# Patient Record
Sex: Female | Born: 1950 | Race: White | Hispanic: No | Marital: Married | Smoking: Never smoker
Health system: Southern US, Community
[De-identification: ages and names within clinical notes are randomized; demographics above are authoritative.]

## PROBLEM LIST (undated history)

## (undated) DIAGNOSIS — K219 Gastro-esophageal reflux disease without esophagitis: Secondary | ICD-10-CM

## (undated) DIAGNOSIS — K279 Peptic ulcer, site unspecified, unspecified as acute or chronic, without hemorrhage or perforation: Secondary | ICD-10-CM

## (undated) DIAGNOSIS — C801 Malignant (primary) neoplasm, unspecified: Secondary | ICD-10-CM

## (undated) HISTORY — PX: SPLENECTOMY: SUR1306

## (undated) HISTORY — PX: OTHER SURGICAL HISTORY: SHX169

## (undated) HISTORY — PX: CHOLECYSTECTOMY: SHX55

## (undated) HISTORY — PX: COLON SURGERY: SHX602

## (undated) HISTORY — PX: ABDOMINAL HYSTERECTOMY: SHX81

## (undated) HISTORY — PX: APPENDECTOMY: SHX54

## (undated) HISTORY — PX: DIAPHRAGM SURGERY: SHX612

---

## 2019-02-10 ENCOUNTER — Other Ambulatory Visit: Payer: Self-pay

## 2019-02-10 ENCOUNTER — Encounter (HOSPITAL_COMMUNITY): Payer: Self-pay

## 2019-02-10 ENCOUNTER — Emergency Department (HOSPITAL_COMMUNITY): Payer: Medicare Other

## 2019-02-10 ENCOUNTER — Emergency Department (HOSPITAL_COMMUNITY)
Admission: EM | Admit: 2019-02-10 | Discharge: 2019-02-10 | Disposition: A | Discharge status: Home / Self Care | Payer: Medicare Other | Attending: Emergency Medicine | Admitting: Emergency Medicine

## 2019-02-10 DIAGNOSIS — Z859 Personal history of malignant neoplasm, unspecified: Secondary | ICD-10-CM | POA: Diagnosis not present

## 2019-02-10 DIAGNOSIS — R1013 Epigastric pain: Secondary | ICD-10-CM | POA: Diagnosis present

## 2019-02-10 DIAGNOSIS — Z8509 Personal history of malignant neoplasm of other digestive organs: Secondary | ICD-10-CM | POA: Insufficient documentation

## 2019-02-10 DIAGNOSIS — R1012 Left upper quadrant pain: Secondary | ICD-10-CM | POA: Diagnosis not present

## 2019-02-10 HISTORY — DX: Malignant (primary) neoplasm, unspecified: C80.1

## 2019-02-10 LAB — COMPREHENSIVE METABOLIC PANEL
ALT: 16 U/L (ref 0–44)
AST: 22 U/L (ref 15–41)
Albumin: 4.3 g/dL (ref 3.5–5.0)
Alkaline Phosphatase: 78 U/L (ref 38–126)
Anion gap: 9 (ref 5–15)
BUN: 11 mg/dL (ref 8–23)
CO2: 24 mmol/L (ref 22–32)
Calcium: 9.6 mg/dL (ref 8.9–10.3)
Chloride: 103 mmol/L (ref 98–111)
Creatinine, Ser: 0.71 mg/dL (ref 0.44–1.00)
GFR calc Af Amer: >60 mL/min (ref >60)
GFR calc non Af Amer: >60 mL/min (ref >60)
Glucose, Bld: 110 mg/dL — ABNORMAL HIGH (ref 70–99)
Potassium: 4.1 mmol/L (ref 3.5–5.1)
Sodium: 136 mmol/L (ref 135–145)
Total Bilirubin: 0.9 mg/dL (ref 0.3–1.2)
Total Protein: 7.5 g/dL (ref 6.5–8.1)

## 2019-02-10 LAB — URINALYSIS, ROUTINE W REFLEX MICROSCOPIC
Bacteria, UA: NONE SEEN
Bilirubin Urine: NEGATIVE
Glucose, UA: NEGATIVE mg/dL
Ketones, ur: NEGATIVE mg/dL
Leukocytes,Ua: NEGATIVE
Nitrite: NEGATIVE
Protein, ur: NEGATIVE mg/dL
Specific Gravity, Urine: 1.002 — ABNORMAL LOW (ref 1.005–1.030)
pH: 7 (ref 5.0–8.0)

## 2019-02-10 LAB — CBC
HCT: 43.5 % (ref 36.0–46.0)
Hemoglobin: 13.9 g/dL (ref 12.0–15.0)
MCH: 30.6 pg (ref 26.0–34.0)
MCHC: 32 g/dL (ref 30.0–36.0)
MCV: 95.8 fL (ref 80.0–100.0)
Platelets: 325 10*3/uL (ref 150–400)
RBC: 4.54 MIL/uL (ref 3.87–5.11)
RDW: 13.8 % (ref 11.5–15.5)
WBC: 7.3 10*3/uL (ref 4.0–10.5)
nRBC: 0 % (ref 0.0–0.2)

## 2019-02-10 LAB — LIPASE, BLOOD: Lipase: 45 U/L (ref 11–51)

## 2019-02-10 MED ORDER — LANSOPRAZOLE 30 MG PO CPDR: 30.0000 mg | DELAYED_RELEASE_CAPSULE | Freq: Two times a day (BID) | ORAL | 0 refills | Status: DC

## 2019-02-10 MED ORDER — SODIUM CHLORIDE 0.9 % IV BOLUS
1000.0000 mL | Freq: Once | INTRAVENOUS | Status: AC
  Administered 2019-02-10: 1000 mL via INTRAVENOUS

## 2019-02-10 MED ORDER — FENTANYL CITRATE (PF) 100 MCG/2ML IJ SOLN
50.0000 ug | Freq: Once | INTRAMUSCULAR | Status: AC
  Administered 2019-02-10: 50 ug via INTRAVENOUS
  Filled 2019-02-10: qty 2

## 2019-02-10 MED ORDER — HYDROCODONE-ACETAMINOPHEN 5-325 MG PO TABS: 1.0000 | ORAL_TABLET | Freq: Three times a day (TID) | ORAL | 0 refills | Status: DC | PRN

## 2019-02-10 MED ORDER — ONDANSETRON HCL 4 MG/2ML IJ SOLN
4.0000 mg | Freq: Once | INTRAMUSCULAR | Status: AC
  Administered 2019-02-10: 4 mg via INTRAVENOUS
  Filled 2019-02-10: qty 2

## 2019-02-10 MED ORDER — ONDANSETRON HCL 4 MG PO TABS: 4.0000 mg | ORAL_TABLET | Freq: Three times a day (TID) | ORAL | 0 refills | Status: DC | PRN

## 2019-02-10 NOTE — ED Provider Notes (Signed)
[facility] EMERGENCY DEPARTMENT Provider Note   CSN: [id number] Arrival date & time: 02/10/19  [id number]     History   Chief Complaint Chief Complaint  Patient presents with   Abdominal Pain    HPI Michaela Berry is a 68 y.o. female.     The history is provided by the patient.  Abdominal Pain Pain location:  Epigastric and LUQ Pain quality: aching and cramping   Pain radiates to:  Does not radiate Pain severity:  Moderate Onset quality:  Gradual Timing:  Intermittent Progression:  Waxing and waning Chronicity:  Chronic Context: previous surgery (Previous intra-abdominal cancer)   Relieved by: prevacid, tums. Worsened by:  Nothing Associated symptoms: nausea   Associated symptoms: no anorexia, no chest pain, no chills, no constipation, no cough, no dysuria, no fever, no hematuria, no shortness of breath, no sore throat and no vomiting   Risk factors: multiple surgeries     Past Medical History:  Diagnosis Date   Cancer ([place])     There are no active problems to display for this patient.   Past Surgical History:  Procedure Laterality Date   APPENDECTOMY     COLON SURGERY       OB History   No obstetric history on file.      Home Medications    Prior to Admission medications   Medication Sig Start Date End Date Taking? Authorizing Provider  lansoprazole (PREVACID) 30 MG capsule Take 1 capsule (30 mg total) by mouth 2 (two) times daily before a meal. 02/10/19 03/12/19  Lennice Sites, DO    Family History History reviewed. No pertinent family history.  Social History Social History   Tobacco Use   Smoking status: Never Smoker  Substance Use Topics   Alcohol use: Never    Frequency: Never   Drug use: Never     Allergies   Nitrous oxide, Erythromycin, Heparin, Iodinated diagnostic agents, Metoclopramide, Nitrofurantoin, Paroxetine hcl, Risedronate, Sulfamethoxazole-trimethoprim, Cefuroxime, Other, and Quinolones   Review of  Systems Review of Systems  Constitutional: Negative for chills and fever.  HENT: Negative for ear pain and sore throat.   Eyes: Negative for pain and visual disturbance.  Respiratory: Negative for cough and shortness of breath.   Cardiovascular: Negative for chest pain and palpitations.  Gastrointestinal: Positive for abdominal pain and nausea. Negative for anorexia, constipation, rectal pain and vomiting.  Genitourinary: Negative for dysuria and hematuria.  Musculoskeletal: Negative for arthralgias and back pain.  Skin: Negative for color change and rash.  Neurological: Negative for seizures and syncope.  All other systems reviewed and are negative.    Physical Exam Updated Vital Signs  ED Triage Vitals [02/10/19 0919]  Enc Vitals Group     BP (!) 147/98     Pulse Rate 95     Resp 16     Temp 98 F (36.7 C)     Temp Source Oral     SpO2 100 %     Weight      Height      Head Circumference      Peak Flow      Pain Score      Pain Loc      Pain Edu?      Excl. in [place]?     Physical Exam Vitals signs and nursing note reviewed.  Constitutional:      General: She is not in acute distress.    Appearance: She is well-developed.  HENT:  Head: Normocephalic and atraumatic.     Mouth/Throat:     Mouth: Mucous membranes are moist.  Eyes:     Extraocular Movements: Extraocular movements intact.     Conjunctiva/sclera: Conjunctivae normal.  Neck:     Musculoskeletal: Neck supple.  Cardiovascular:     Rate and Rhythm: Normal rate and regular rhythm.     Heart sounds: Normal heart sounds. No murmur.  Pulmonary:     Effort: Pulmonary effort is normal. No respiratory distress.     Breath sounds: Normal breath sounds.  Abdominal:     General: Abdomen is flat.     Palpations: Abdomen is soft.     Tenderness: There is abdominal tenderness in the epigastric area and left upper quadrant. There is no right CVA tenderness, left CVA tenderness, guarding or rebound. Negative  signs include Murphy's sign and Rovsing's sign.  Skin:    General: Skin is warm and dry.     Capillary Refill: Capillary refill takes less than 2 seconds.  Neurological:     Mental Status: She is alert.      ED Treatments / Results  Labs (all labs ordered are listed, but only abnormal results are displayed) Labs Reviewed  COMPREHENSIVE METABOLIC PANEL - Abnormal; Notable for the following components:      Result Value   Glucose, Bld 110 (*)    All other components within normal limits  URINALYSIS, ROUTINE W REFLEX MICROSCOPIC - Abnormal; Notable for the following components:   Color, Urine COLORLESS (*)    Specific Gravity, Urine 1.002 (*)    Hgb urine dipstick SMALL (*)    All other components within normal limits  LIPASE, BLOOD  CBC    EKG None  Radiology Ct Abdomen Pelvis Wo Contrast  Result Date: 02/10/2019 CLINICAL DATA:  69 year old female with history of abdominal distension and pain. EXAM: CT ABDOMEN AND PELVIS WITHOUT CONTRAST TECHNIQUE: Multidetector CT imaging of the abdomen and pelvis was performed following the standard protocol without IV contrast. COMPARISON:  No priors. FINDINGS: Lower chest: Unremarkable. Hepatobiliary: No definite suspicious cystic or solid hepatic lesions are confidently identified on today's noncontrast CT examination. Status post cholecystectomy. Pancreas: No definite pancreatic mass or peripancreatic fluid collections or inflammatory changes are noted on today's noncontrast CT examination. Spleen: Status post splenectomy. Adrenals/Urinary Tract: No calcifications are identified within the collecting system of either kidney, along the course of either ureter, or within the lumen of the urinary bladder. There is mild bilateral hydroureteronephrosis. Unenhanced appearance of the kidneys and bilateral adrenal glands is otherwise unremarkable. Urinary bladder is moderately distended. Stomach/Bowel: Unenhanced appearance of the stomach is normal. No  pathologic dilatation of small bowel or colon. Postoperative changes associated with the cecum. Appendix is surgically absent. Vascular/Lymphatic: Aortic atherosclerosis. No lymphadenopathy noted in the abdomen or pelvis. Reproductive: Status post hysterectomy. Ovaries are not confidently identified may be surgically absent or atrophic. Other: No significant volume of ascites.  No pneumoperitoneum. Musculoskeletal: There are no aggressive appearing lytic or blastic lesions noted in the visualized portions of the skeleton. IMPRESSION: 1. Very mild bilateral hydroureteronephrosis with moderate dilatation of the urinary bladder. There are no obstructive radiopaque calculi identified. Findings may suggest bladder outlet obstruction. 2. No other acute findings are noted elsewhere in the abdomen or pelvis to account for the patient's symptoms. 3. Aortic atherosclerosis. Electronically Signed   By: Vinnie Langton M.D.   On: 02/10/2019 12:31    Procedures Procedures (including critical care time)  Medications Ordered in ED Medications  sodium chloride 0.9 % bolus 1,000 mL (0 mLs Intravenous Stopped 02/10/19 1227)  fentaNYL (SUBLIMAZE) injection 50 mcg (50 mcg Intravenous Given 02/10/19 1123)  ondansetron (ZOFRAN) injection 4 mg (4 mg Intravenous Given 02/10/19 1123)     Initial Impression / Assessment and Plan / ED Course  I have reviewed the triage vital signs and the nursing notes.  Pertinent labs & imaging results that were available during my care of the patient were reviewed by me and considered in my medical decision making (see chart for details).     Leonella Sylvan is a 68 year old female with history of adenocarcinoma of the appendix/carcinomatosis who presents to the ED with abdominal pain.  Patient with normal vitals.  No fever.  Pain has been on and off for the last several weeks.  Mostly in her epigastric and left upper quadrant.  She is to have an EGD done by [facility] at the end of the month.   She is currently on 15 mg of Prevacid daily as well as Tums.  Patient denies any melena, no hematochezia.  Has some nausea at times but no vomiting.  Pain slightly worse over the last several days.  Denies any chest pain, shortness of breath.  Has some tenderness in the epigastric region and left upper quadrant on exam.  No true signs of peritonitis.  Lab work showed no significant anemia, electrolyte abnormality, kidney injury.  Hemoglobin is stable.  No melena.  Doubt GI bleed.  CT scan to be performed to rule out perforation or other intra-abdominal process.  Does not seem consistent with a small bowel obstruction.  She is passing gas, no active vomiting.  No urinary symptoms and urinalysis negative for infection.  Will give patient IV fluids, IV fentanyl, IV Zofran.  CT scan of the abdomen pelvis is overall unremarkable.  There was very mild bilateral hydronephrosis but no kidney stones, no mass.  Patient is not having any difficulty urinating.  No retention.  No urinary tract infection.  Overall incidental finding.  Patient felt improved following IV fluids, IV Zofran, IV fentanyl.  Will prescribe her narcotic pain medicine for breakthrough pain, she does have follow-up with GI in place.  She will increase her Prevacid to 30 mg daily.  This chart was dictated using voice recognition software.  Despite best efforts to proofread,  errors can occur which can change the documentation meaning.    Final Clinical Impressions(s) / ED Diagnoses   Final diagnoses:  Epigastric pain    ED Discharge Orders         Ordered    lansoprazole (PREVACID) 30 MG capsule  2 times daily before meals     02/10/19 1104           Viva Gallaher, Quita Skye, DO 02/10/19 1245

## 2019-02-10 NOTE — Discharge Instructions (Addendum)
Please return to the ED if you develop worsening pain, black stools.  Follow-up with GI as scheduled.

## 2019-02-10 NOTE — ED Triage Notes (Signed)
Pt endorses LUQ pain x 5 weeks. Scheduled for 2 endoscopies later this month but pain is too bad to wait now. Endorses nausea, no vomiting and no diarrhea. Had CT scan on 11/08 at [facility].

## 2019-05-19 ENCOUNTER — Other Ambulatory Visit: Payer: Self-pay

## 2019-05-19 ENCOUNTER — Encounter: Payer: Self-pay | Admitting: Internal Medicine

## 2019-05-19 ENCOUNTER — Ambulatory Visit (INDEPENDENT_AMBULATORY_CARE_PROVIDER_SITE_OTHER): Payer: Medicare Other | Admitting: Internal Medicine

## 2019-05-19 VITALS — BP 124/80 | HR 70 | Temp 97.9°F | Ht 63.75 in | Wt 125.6 lb

## 2019-05-19 DIAGNOSIS — E7849 Other hyperlipidemia: Secondary | ICD-10-CM

## 2019-05-19 DIAGNOSIS — M791 Myalgia, unspecified site: Secondary | ICD-10-CM | POA: Diagnosis not present

## 2019-05-19 DIAGNOSIS — T466X5A Adverse effect of antihyperlipidemic and antiarteriosclerotic drugs, initial encounter: Secondary | ICD-10-CM | POA: Diagnosis not present

## 2019-05-19 NOTE — Patient Instructions (Signed)
Medication Instructions:  Your physician recommends that you continue on your current medications as directed. Please refer to the Current Medication list given to you today.  *Repatha/Praluent - PCSK9  *If you need a refill on your cardiac medications before your next appointment, please call your pharmacy*  Follow-Up: At [facility], you and your health needs are our priority.  As part of our continuing mission to provide you with exceptional heart care, we have created designated Provider Care Teams.  These Care Teams include your primary Cardiologist (physician) and Advanced Practice Providers (APPs -  Physician Assistants and Nurse Practitioners) who all work together to provide you with the care you need, when you need it.  We recommend signing up for the patient portal called "MyChart".  Sign up information is provided on this After Visit Summary.  MyChart is used to connect with patients for Virtual Visits (Telemedicine).  Patients are able to view lab/test results, encounter notes, upcoming appointments, etc.  Non-urgent messages can be sent to your provider as well.   To learn more about what you can do with MyChart, go to [email or web address].    Your next appointment:  AS NEEDED with Dr. Debara Pickett  Once you decide on preferred treatment, we will work on medication approval and arrange follow up

## 2019-05-19 NOTE — Progress Notes (Signed)
LIPID CLINIC CONSULT NOTE  Chief Complaint:  Manage dyslipidemia  Primary Care Physician: Patient, No Pcp Per  Primary Cardiologist:  No primary care provider on file.  HPI:  Michaela Berry is a 69 y.o. female who is being seen today for the evaluation of *dyslipidemia at the request of Michaela Pepper, MD.  This is a pleasant 69 year old female kindly referred by Dr. Binnie Kand with a past medical history significant for and appendiceal cancer.  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  Family history is significant for mom who had some ASCVD, A. fib and a pacemaker.  Personally she is struggled with longstanding dyslipidemia but has been intolerant to multiple statins including Lipitor, Crestor, red yeast rice, cholesterol off and other treatments.  This caused her myalgias, joint pain and feeling spaced out.  Her most recent lipid showed total cholesterol 338, triglycerides 4 2, HDL 54 and LDL of 201.  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  Her record of lipids going back to 2007 showed total cholesterol 277 at that time with LDL 154, perhaps the lowest she was able to achieve however at the time was having side effects.  She has no known coronary disease.  PMHx:  Past Medical History:  Diagnosis Date  . Cancer [facility])     Past Surgical History:  Procedure Laterality Date  . APPENDECTOMY    . COLON SURGERY      FAMHx:  Family History  Problem Relation Age of Onset  . CAD Mother   . Atrial fibrillation Mother     SOCHx:   reports that she has never smoked. She has never used smokeless tobacco. She reports previous alcohol use. No history on file for drug.  ALLERGIES:  Allergies  Allergen Reactions  . Nitrous Oxide Anaphylaxis  . Erythromycin   . Heparin Hives  . Iodinated Diagnostic Agents     Total body edema and when exhaling it felt hot. Total body edema and when exhaling it felt hot.   . Metoclopramide Other  (See Comments)    Muscle tremors and spasms that lasted 9 hours  Muscle pain   . Nitrofurantoin   . Paroxetine Hcl Other (See Comments)    Clonic muscle spasms x9hrs  . Risedronate Other (See Comments)  . Sulfamethoxazole-Trimethoprim   . Cefuroxime Rash  . Other Hives and Rash  . Quinolones Other (See Comments) and Palpitations    Tendonitis arrhythmia Tendonitis     ROS: Pertinent items noted in HPI and remainder of comprehensive ROS otherwise negative.  HOME MEDS: Current Outpatient Medications on File Prior to Visit  Medication Sig Dispense Refill  . acetaminophen (TYLENOL) 500 MG tablet Take by mouth.    Marland Kitchen albuterol (VENTOLIN HFA) 108 (90 Base) MCG/ACT inhaler     . alfuzosin (UROXATRAL) 10 MG 24 hr tablet Take 10 mg by mouth daily.    . cephALEXin (KEFLEX) 250 MG capsule     . D-Mannose 500 MG CAPS Take by mouth.    . pantoprazole (PROTONIX) 40 MG tablet Take 40 mg by mouth 2 (two) times daily.    . Turmeric 400 MG CAPS Take by mouth.     No current facility-administered medications on file prior to visit.    LABS/IMAGING: No results found for this or any previous visit (from the past 48 hour(s)). No results found.  LIPID PANEL: No results found for: CHOL, TRIG, HDL, CHOLHDL, VLDL, LDLCALC, LDLDIRECT  WEIGHTS: Wt  Readings from Last 3 Encounters:  05/19/19 125 lb 9.6 oz (57 kg)    VITALS: BP 124/80   Pulse 70   Temp 97.9 F (36.6 C)   Ht 5' 3.75" (1.619 m)   Wt 125 lb 9.6 oz (57 kg)   SpO2 99%   BMI 21.73 kg/m   EXAM: General appearance: alert and no distress Lungs: clear to auscultation bilaterally Heart: regular rate and rhythm, S1, S2 normal, no murmur, click, rub or gallop Extremities: extremities normal, atraumatic, no cyanosis or edema Neurologic: Grossly normal  EKG: Deferred  ASSESSMENT: 1. Probable FH-by Baruch Merl criteria 2. Family history of elevated cholesterol 3. Statin intolerance-myalgias/joint pains  PLAN: 1.   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  Is possible she could have a less pathogenic variant however by current guidelines treatment to lower LDL greater than 50% is recommended.  She is a good candidate for PCSK9 inhibitor.  She wants to further consider that.  I also offered coronary calcium scoring as an alternative to see if there is any evidence of premature coronary disease.  She will consider that as well.  She is to contact our office if she wishes to proceed with additional therapies.  Thanks again for the kind referral.  Michaela Casino, MD, FACC, [place] Director of the [facility] Lipid [facility] &  Cardiovascular Risk Reduction Clinic Diplomate of the American Board of Clinical Lipidology Attending Cardiologist  Direct Dial: [phone]  Fax: [phone]  Website:  [email or web address] Michaela Berry 05/20/2019, 10:24 PM

## 2019-05-20 ENCOUNTER — Encounter: Payer: Self-pay | Admitting: Internal Medicine

## 2020-06-07 ENCOUNTER — Telehealth: Payer: Self-pay | Admitting: Internal Medicine

## 2020-06-07 DIAGNOSIS — E7849 Other hyperlipidemia: Secondary | ICD-10-CM

## 2020-06-07 DIAGNOSIS — T466X5A Adverse effect of antihyperlipidemic and antiarteriosclerotic drugs, initial encounter: Secondary | ICD-10-CM

## 2020-06-07 NOTE — Telephone Encounter (Signed)
Patient requested lipid clinic appointment via mychart, but needs lab orders.

## 2020-06-07 NOTE — Telephone Encounter (Signed)
Fasting lipid panel ordered. MyChart message sent to patient - lab order mailed

## 2020-06-17 ENCOUNTER — Other Ambulatory Visit: Payer: Self-pay | Admitting: Family Medicine

## 2020-06-17 DIAGNOSIS — Z1231 Encounter for screening mammogram for malignant neoplasm of breast: Secondary | ICD-10-CM

## 2020-07-03 ENCOUNTER — Other Ambulatory Visit: Payer: Self-pay | Admitting: Family Medicine

## 2020-07-03 DIAGNOSIS — M8580 Other specified disorders of bone density and structure, unspecified site: Secondary | ICD-10-CM

## 2020-08-07 ENCOUNTER — Other Ambulatory Visit: Payer: Self-pay

## 2020-08-07 ENCOUNTER — Ambulatory Visit
Admission: RE | Admit: 2020-08-07 | Discharge: 2020-08-07 | Disposition: A | Discharge status: Home / Self Care | Payer: Medicare Other | Source: Ambulatory Visit

## 2020-08-07 DIAGNOSIS — Z1231 Encounter for screening mammogram for malignant neoplasm of breast: Secondary | ICD-10-CM

## 2020-08-30 LAB — LIPID PANEL
Chol/HDL Ratio: 5.8 ratio — ABNORMAL HIGH (ref 0.0–4.4)
Cholesterol, Total: 368 mg/dL — ABNORMAL HIGH (ref 100–199)
HDL: 64 mg/dL (ref >39)
LDL Chol Calc (NIH): 245 mg/dL — ABNORMAL HIGH (ref 0–99)
Triglycerides: 281 mg/dL — ABNORMAL HIGH (ref 0–149)
VLDL Cholesterol Cal: 59 mg/dL — ABNORMAL HIGH (ref 5–40)

## 2020-09-06 ENCOUNTER — Encounter: Payer: Self-pay | Admitting: Internal Medicine

## 2020-09-06 ENCOUNTER — Telehealth: Payer: Self-pay | Admitting: Internal Medicine

## 2020-09-06 ENCOUNTER — Other Ambulatory Visit: Payer: Self-pay

## 2020-09-06 ENCOUNTER — Ambulatory Visit (INDEPENDENT_AMBULATORY_CARE_PROVIDER_SITE_OTHER): Payer: Medicare Other | Admitting: Internal Medicine

## 2020-09-06 VITALS — BP 113/63 | HR 73 | Ht 63.75 in | Wt 124.4 lb

## 2020-09-06 DIAGNOSIS — T466X5A Adverse effect of antihyperlipidemic and antiarteriosclerotic drugs, initial encounter: Secondary | ICD-10-CM

## 2020-09-06 DIAGNOSIS — M791 Myalgia, unspecified site: Secondary | ICD-10-CM

## 2020-09-06 DIAGNOSIS — T466X5D Adverse effect of antihyperlipidemic and antiarteriosclerotic drugs, subsequent encounter: Secondary | ICD-10-CM | POA: Diagnosis not present

## 2020-09-06 DIAGNOSIS — E7849 Other hyperlipidemia: Secondary | ICD-10-CM

## 2020-09-06 NOTE — Patient Instructions (Addendum)
Medication Instructions:  Dr. Debara Pickett recommends Repatha 140mg /mL or Praluent 150mg /mL (PCSK9). This is an injectable cholesterol medication self-administered once every 14 days. This medication will likely need prior approval with your insurance company, which we will work on. If the medication is not approved initially, we may need to do an appeal with your insurance.   Administer medication in area of fatty tissue such as abdomen, outer thigh, back of upper arm - and rotate site with each injection Store medication in refrigerator until ready to administer - allow to sit at room temp for 30 mins - 1 hour prior to injection Dispose of medication in a SHARPS container - your pharmacy should be able to direct you on this and proper disposal   If you need a co-pay card for Repatha: [email or web address]-[email or web address] >> paying for Repatha or red box that says "Metter" in top right If you need a co-pay card for Praluent: WedMap.it >> starting & paying for Praluent  Patient Assistance:   The PAN Foundation: https://www.panfoundation.org/disease-funds/hypercholesterolemia/ -- can sign up for wait list  The [employer] Well foundation offers assistance to help pay for medication copays.  They will cover copays for all cholesterol lowering meds, including statins, fibrates, omega-3 fish oils like Vascepa, ezetimibe, Repatha, Praluent, Nexletol, Nexlizet.  The cards are usually good for $2,500 or 12 months, whichever comes first. Go to healthwellfoundation.org Click on "Apply Now" Answer questions as to whom is applying (patient or representative) Your disease fund will be "hypercholesterolemia - Medicare access" They will ask questions about finances and which medications you are taking for cholesterol When you submit, the approval is usually within minutes.  You will need to print the card information from the site You will need to show this information to your pharmacy, they will bill your Medicare Part D plan  first -then bill Health Well --for the copay.   You can also call them at [phone], although the hold times can be quite long.     *If you need a refill on your cardiac medications before your next appointment, please call your pharmacy*   Lab Work: FASTING lab work in 3-4 months to check cholesterol   If you have labs (blood work) drawn today and your tests are completely normal, you will receive your results only by: [place] (if you have MyChart) OR A paper copy in the mail If you have any lab test that is abnormal or we need to change your treatment, we will call you to review the results.   Testing/Procedures: Genetic Test done today 09/06/20 -- results should be back in 3-4 weeks, at which time Dr. Debara Pickett will review and you will be notified   Follow-Up: At [facility], you and your health needs are our priority.  As part of our continuing mission to provide you with exceptional heart care, we have created designated Provider Care Teams.  These Care Teams include your primary Cardiologist (physician) and Advanced Practice Providers (APPs -  Physician Assistants and Nurse Practitioners) who all work together to provide you with the care you need, when you need it.  We recommend signing up for the patient portal called "MyChart".  Sign up information is provided on this After Visit Summary.  MyChart is used to connect with patients for Virtual Visits (Telemedicine).  Patients are able to view lab/test results, encounter notes, upcoming appointments, etc.  Non-urgent messages can be sent to your provider as well.   To learn more about what you can do  with MyChart, go to [email or web address].    Your next appointment:   4 month(s) - lipid clinic  The format for your next appointment:   In Person  Provider:   K. Mali Hilty, MD   Other Instructions

## 2020-09-06 NOTE — Progress Notes (Signed)
LIPID CLINIC CONSULT NOTE  Chief Complaint:  Follow-up dyslipidemia  Primary Care Physician: London Pepper, MD  Primary Cardiologist:  None  HPI:  Michaela Berry is a 70 y.o. female who is being seen today for the evaluation of *dyslipidemia at the request of No ref. provider found.  This is a pleasant 70 year old female kindly referred by Dr. Orland Mustard with a past medical history significant for and appendiceal cancer.  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  Family history is significant for mom who had some ASCVD, A. fib and a pacemaker.  Personally she is struggled with longstanding dyslipidemia but has been intolerant to multiple statins including Lipitor, Crestor, red yeast rice, cholesterol off and other treatments.  This caused her myalgias, joint pain and feeling spaced out.  Her most recent lipid showed total cholesterol 338, triglycerides 4 2, HDL 54 and LDL of 201.  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  Her record of lipids going back to 2007 showed total cholesterol 277 at that time with LDL 154, perhaps the lowest she was able to achieve however at the time was having side effects.  She has no known coronary disease.  09/06/2020  Michaela Berry returns for follow-up.  I saw her last year however she was undecided about whether she wanted to treat her lipids.  She returns now and had repeat lipids done showing total cholesterol 368, HDL 64, LDL 245 and triglycerides 281.  Given her marked lipids, this is certainly possibly suggestive of familial hyperlipidemia or perhaps familial combined hyperlipidemia.  She does report some coronary disease in the family however at later ages.  Her mother also had Alzheimer's.  She is now considering some type of therapy.  Unfortunately she was intolerant to multiple statins.  We have previously discussed PCSK9 inhibitors or possibly Leqvio, however that has been logistically difficult to get  to patients.  In addition, it may be beneficial to know her genetic status if were able to identify an inherited mutation.   PMHx:  Past Medical History:  Diagnosis Date   Cancer [facility])     Past Surgical History:  Procedure Laterality Date   APPENDECTOMY     COLON SURGERY      FAMHx:  Family History  Problem Relation Age of Onset   CAD Mother    Atrial fibrillation Mother     SOCHx:   reports that she has never smoked. She has never used smokeless tobacco. She reports previous alcohol use. No history on file for drug use.  ALLERGIES:  Allergies  Allergen Reactions   Nitrous Oxide Anaphylaxis   Erythromycin    Heparin Hives   Iodinated Diagnostic Agents     Total body edema and when exhaling it felt hot. Total body edema and when exhaling it felt hot.    Metoclopramide Other (See Comments)    Muscle tremors and spasms that lasted 9 hours  Muscle pain    Nitrofurantoin    Paroxetine Hcl Other (See Comments)    Clonic muscle spasms x9hrs   Risedronate Other (See Comments)   Sulfamethoxazole-Trimethoprim    Cefuroxime Rash   Other Hives and Rash   Quinolones Other (See Comments) and Palpitations    Tendonitis arrhythmia Tendonitis     ROS: Pertinent items noted in HPI and remainder of comprehensive ROS otherwise negative.  HOME MEDS: Current Outpatient Medications on File Prior to Visit  Medication Sig Dispense Refill   acetaminophen (  TYLENOL) 500 MG tablet Take by mouth every 4 (four) hours as needed.     cephALEXin (KEFLEX) 250 MG capsule Take 250 mg by mouth as needed.     Cholecalciferol 50 MCG (2000 UT) TABS Take by mouth daily.     D-Mannose 500 MG CAPS Take 500 mg by mouth every evening.     Magnesium Oxide 250 MG TABS Take by mouth 2 (two) times daily.     Omega-3 Fatty Acids (ODORLESS COATED FISH OIL) 1000 MG CPDR Take by mouth once a week.     pantoprazole (PROTONIX) 40 MG tablet Take 40 mg by mouth 2 (two) times daily.     Probiotic Product  (ALIGN) CHEW Chew by mouth daily.     Turmeric 400 MG CAPS Take 400 mg by mouth 2 (two) times daily.     vitamin E 180 MG (400 UNITS) capsule Take by mouth daily.     Zinc 30 MG TABS Take 30 mg by mouth daily.     No current facility-administered medications on file prior to visit.    LABS/IMAGING: No results found for this or any previous visit (from the past 48 hour(s)). No results found.  LIPID PANEL:    Component Value Date/Time   CHOL 368 (H) 08/29/2020 0952   TRIG 281 (H) 08/29/2020 0952   HDL 64 08/29/2020 0952   CHOLHDL 5.8 (H) 08/29/2020 0952   LDLCALC 245 (H) 08/29/2020 0952    WEIGHTS: Wt Readings from Last 3 Encounters:  09/06/20 124 lb 6.4 oz (56.4 kg)  05/19/19 125 lb 9.6 oz (57 kg)    VITALS: BP 113/63 (BP Location: Left Arm, Patient Position: Sitting, Cuff Size: Normal)   Pulse 73   Ht 5' 3.75" (1.619 m)   Wt 124 lb 6.4 oz (56.4 kg)   SpO2 97%   BMI 21.52 kg/m   EXAM: Deferred  EKG: Deferred  ASSESSMENT: Probable FH-by Baruch Merl criteria Family history of elevated cholesterol Statin intolerance-myalgias/joint pains  PLAN: 1.   Michaela Berry again is very likely to have a familial hyperlipidemia, possibly familial combined hyperlipidemia.  I would like to send a genetic test today which she is agreeable to.  She understands that if is not covered with insurance cost may be up to $299.  In addition since she has been statin intolerant and LDL remains well above 190, I would recommend a PCSK9 inhibitor.  We will plan repeat lipids in about 3 months after starting therapy.  Follow-up with me at that time.  [name], MD, [facility], [place] Director of the [facility] Lipid Disorders &  Cardiovascular Risk Reduction Clinic Diplomate of the American Board of Clinical Lipidology Attending Cardiologist  Direct Dial: [phone]  Fax: [phone]  Website:  [email or web address] Michaela Berry 09/06/2020, 8:24 AM

## 2020-09-06 NOTE — Telephone Encounter (Signed)
PA for praluent 150mg /mL submitted via CMM This is preferred PCSK9i with insurance [facility] medicare) (Key: [id number])

## 2020-09-09 NOTE — Telephone Encounter (Signed)
Approved. This drug has been approved under the Member's Medicare Part D benefit. Approved quantity: 2 units per 28 day(s). You may fill up to a 90 day supply except for those on Specialty Tier 5, which can be filled up to a 30 day supply.

## 2020-09-15 MED ORDER — PRALUENT 150 MG/ML ~~LOC~~ SOAJ: 1.0000 | SUBCUTANEOUS | 11 refills | Status: AC

## 2020-09-15 NOTE — Addendum Note (Signed)
Addended by: Fidel Levy on: 09/15/2020 04:10 PM   Modules accepted: Orders

## 2021-01-08 ENCOUNTER — Ambulatory Visit: Payer: Medicare Other | Admitting: Internal Medicine

## 2021-01-15 ENCOUNTER — Other Ambulatory Visit: Payer: Self-pay

## 2021-01-15 ENCOUNTER — Ambulatory Visit
Admission: RE | Admit: 2021-01-15 | Discharge: 2021-01-15 | Disposition: A | Discharge status: Home / Self Care | Payer: Medicare Other | Source: Ambulatory Visit | Attending: Family Medicine | Admitting: Family Medicine

## 2021-01-15 DIAGNOSIS — M8580 Other specified disorders of bone density and structure, unspecified site: Secondary | ICD-10-CM

## 2021-02-13 ENCOUNTER — Ambulatory Visit
Admission: RE | Admit: 2021-02-13 | Discharge: 2021-02-13 | Disposition: A | Discharge status: Home / Self Care | Payer: Medicare Other | Source: Ambulatory Visit | Attending: Gastroenterology | Admitting: Gastroenterology

## 2021-02-13 ENCOUNTER — Other Ambulatory Visit: Payer: Self-pay | Admitting: Gastroenterology

## 2021-02-13 DIAGNOSIS — K5900 Constipation, unspecified: Secondary | ICD-10-CM

## 2022-03-23 IMAGING — MG MM DIGITAL SCREENING BILAT W/ TOMO AND CAD
8 series · 9 of 24 positions shown · non-contrast
Comparison: Previous exam(s).

CLINICAL DATA: Screening.

EXAM:
DIGITAL SCREENING BILATERAL MAMMOGRAM WITH TOMOSYNTHESIS AND CAD
TECHNIQUE: Bilateral screening digital craniocaudal and mediolateral oblique
mammograms were obtained. Bilateral screening digital breast
tomosynthesis was performed. The images were evaluated with
computer-aided detection.

[R CC synth-2D]
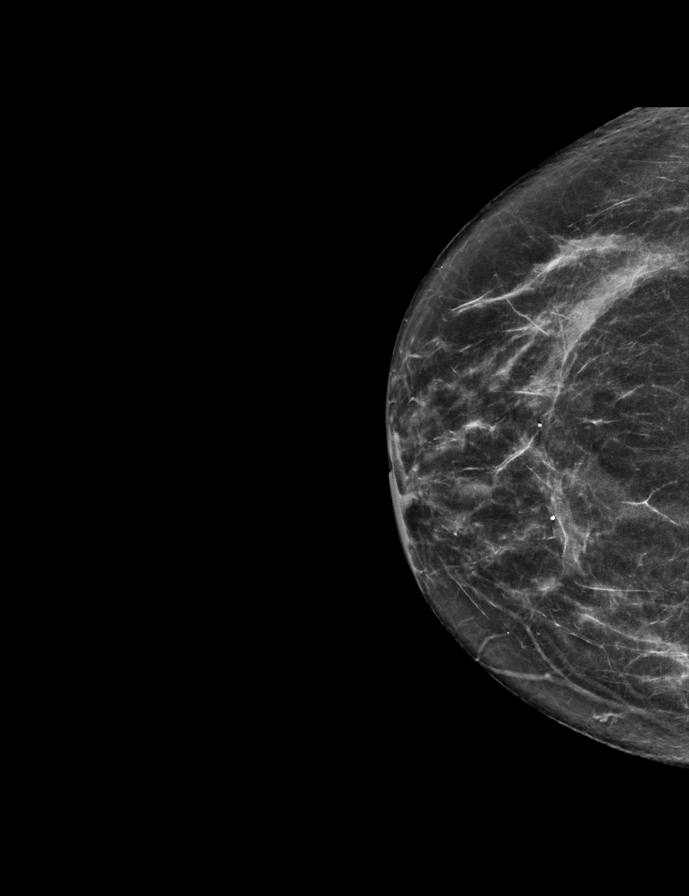

[L CC synth-2D]
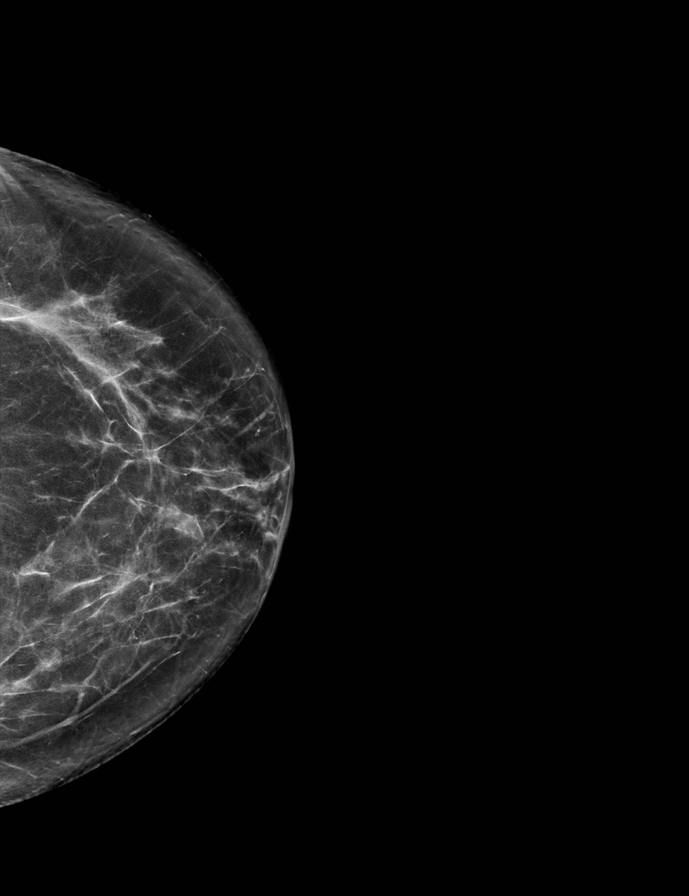

[R MLO synth-2D]
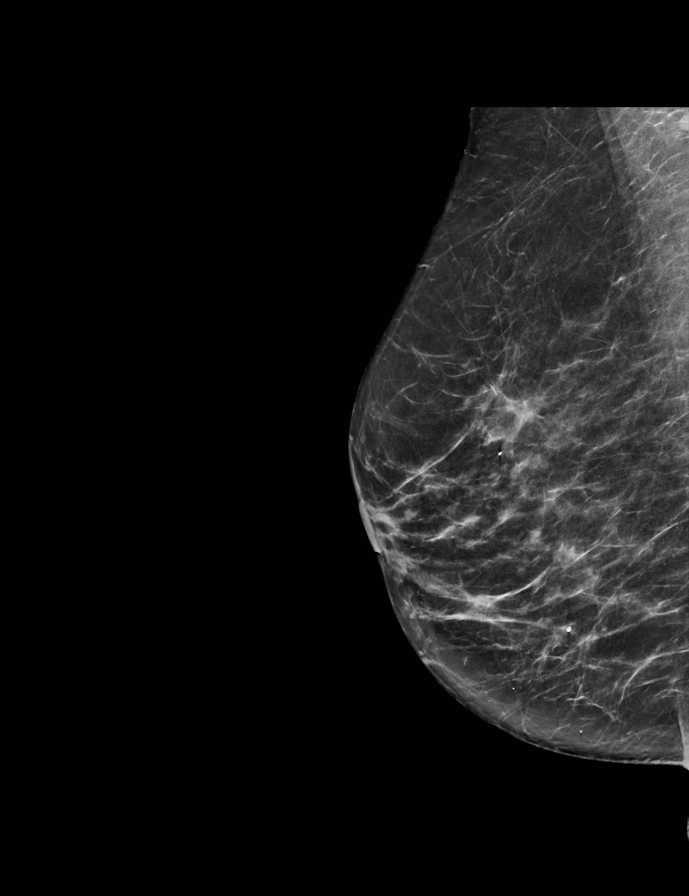

[L MLO synth-2D]
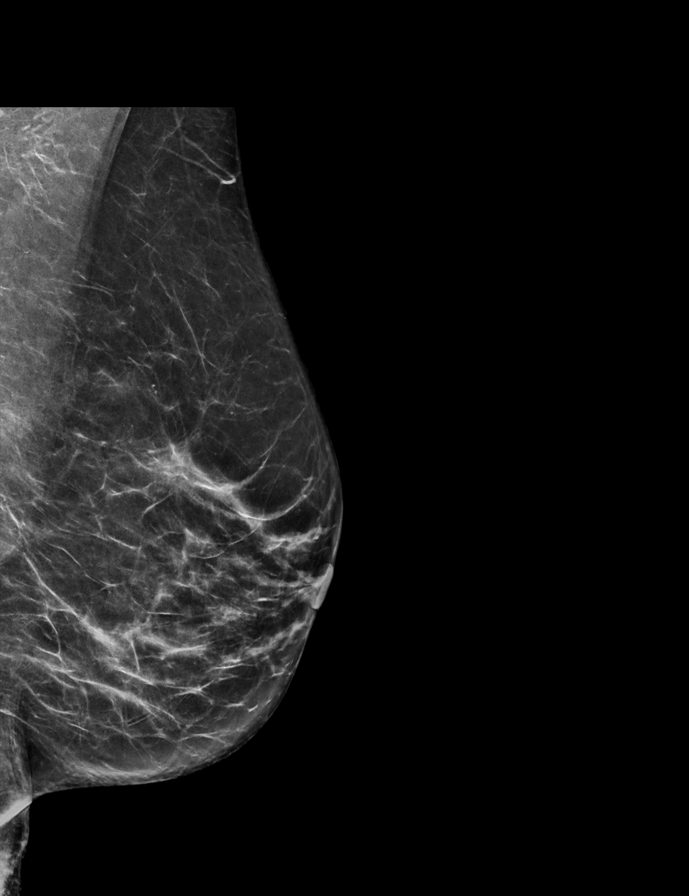

[R MLO tomo · 2 of 61 frames shown]
[frame 20/61]
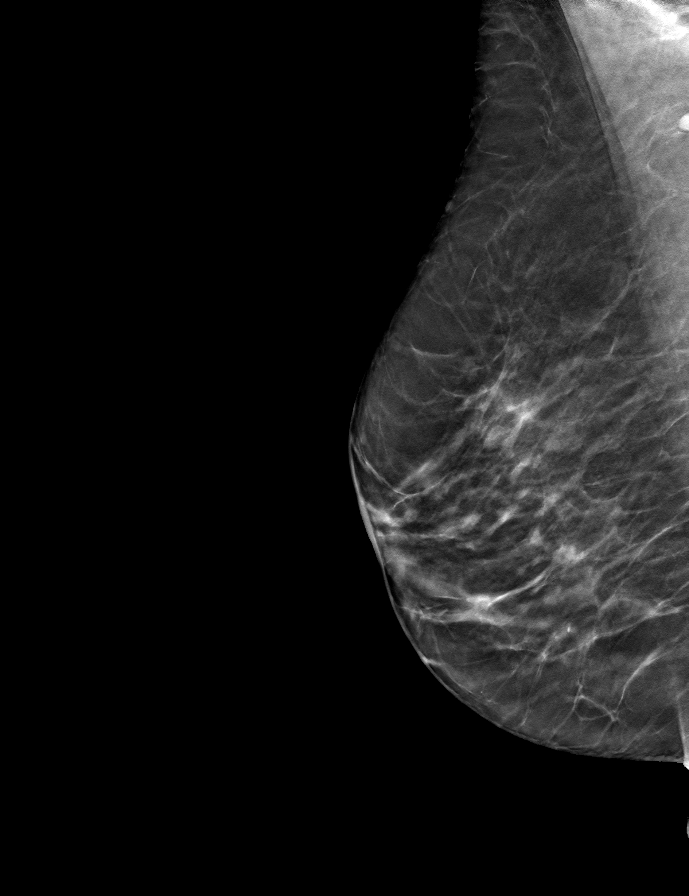
[frame 31/61]
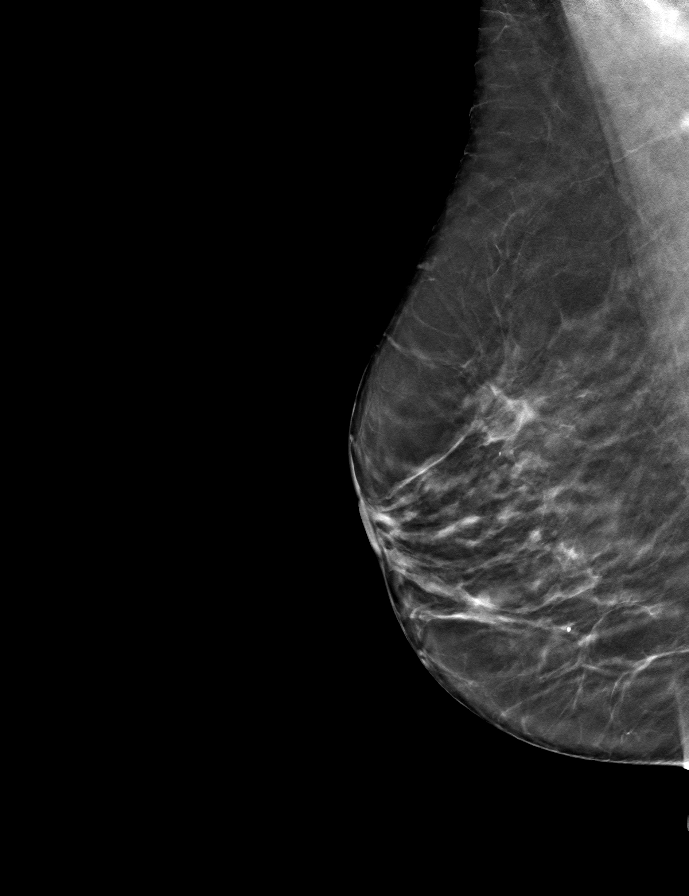

[L CC tomo · tomo slice 35/68.0]
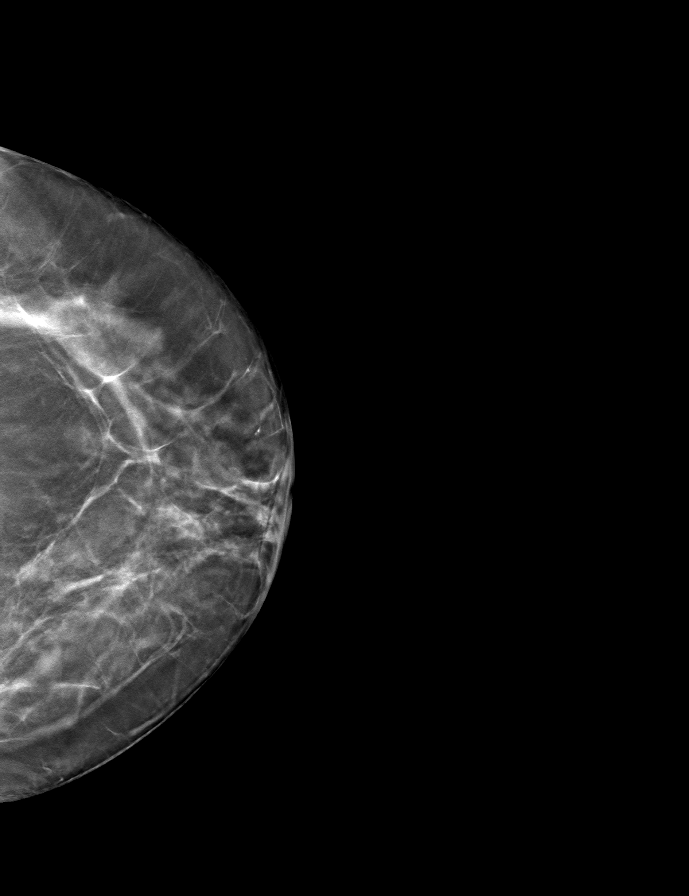

[R CC tomo · tomo slice 31/60.0]
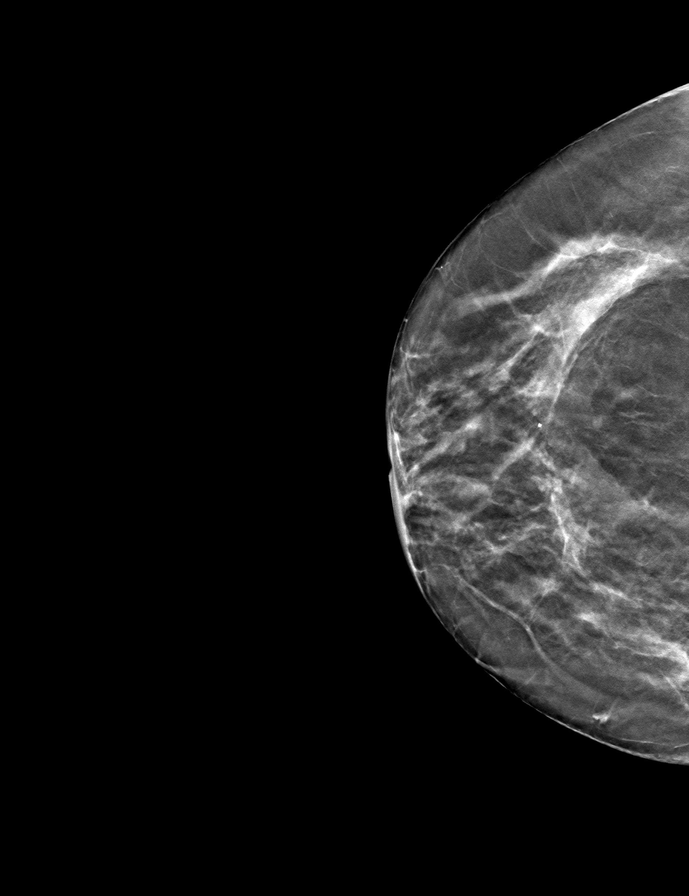

[L MLO tomo · tomo slice 33/65.0]
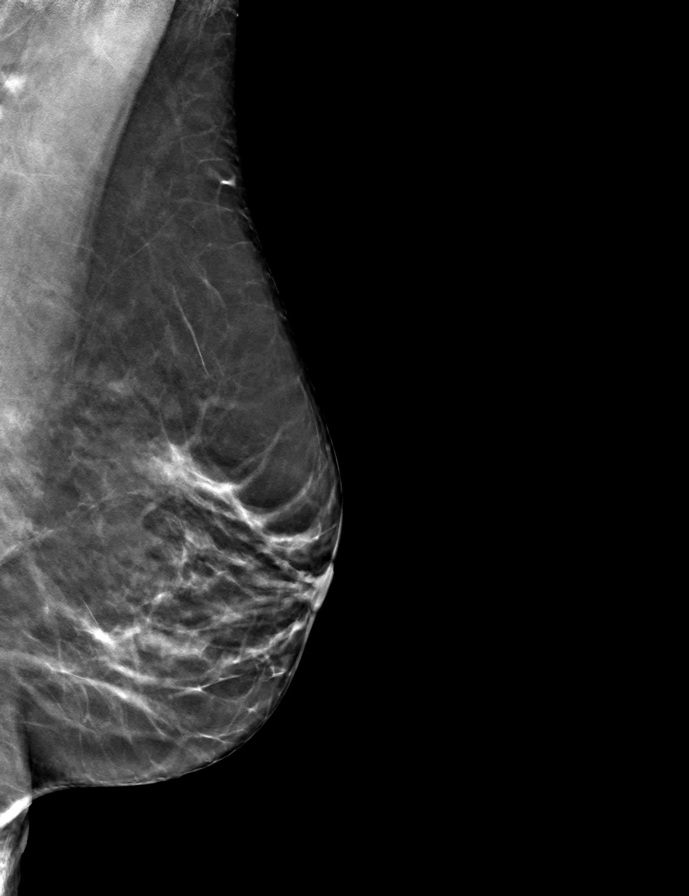

[9 of 24 positions shown; findings below may reference images not displayed]

ACR Breast Density Category c: The breast tissue is heterogeneously
dense, which may obscure small masses.
FINDINGS: There are no findings suspicious for malignancy. The images were
evaluated with computer-aided detection.
IMPRESSION: No mammographic evidence of malignancy. A result letter of this
screening mammogram will be mailed directly to the patient.

RECOMMENDATION:
Screening mammogram in one year. (Code:T4-5-GWO)

BI-RADS CATEGORY  1: Negative.

## 2022-09-29 IMAGING — CR DG ABDOMEN 1V
1 series · 1 of 1 positions shown · non-contrast
Comparison: None.

CLINICAL DATA: Constipation

EXAM:
ABDOMEN - 1 VIEW

[t abdomen supine]
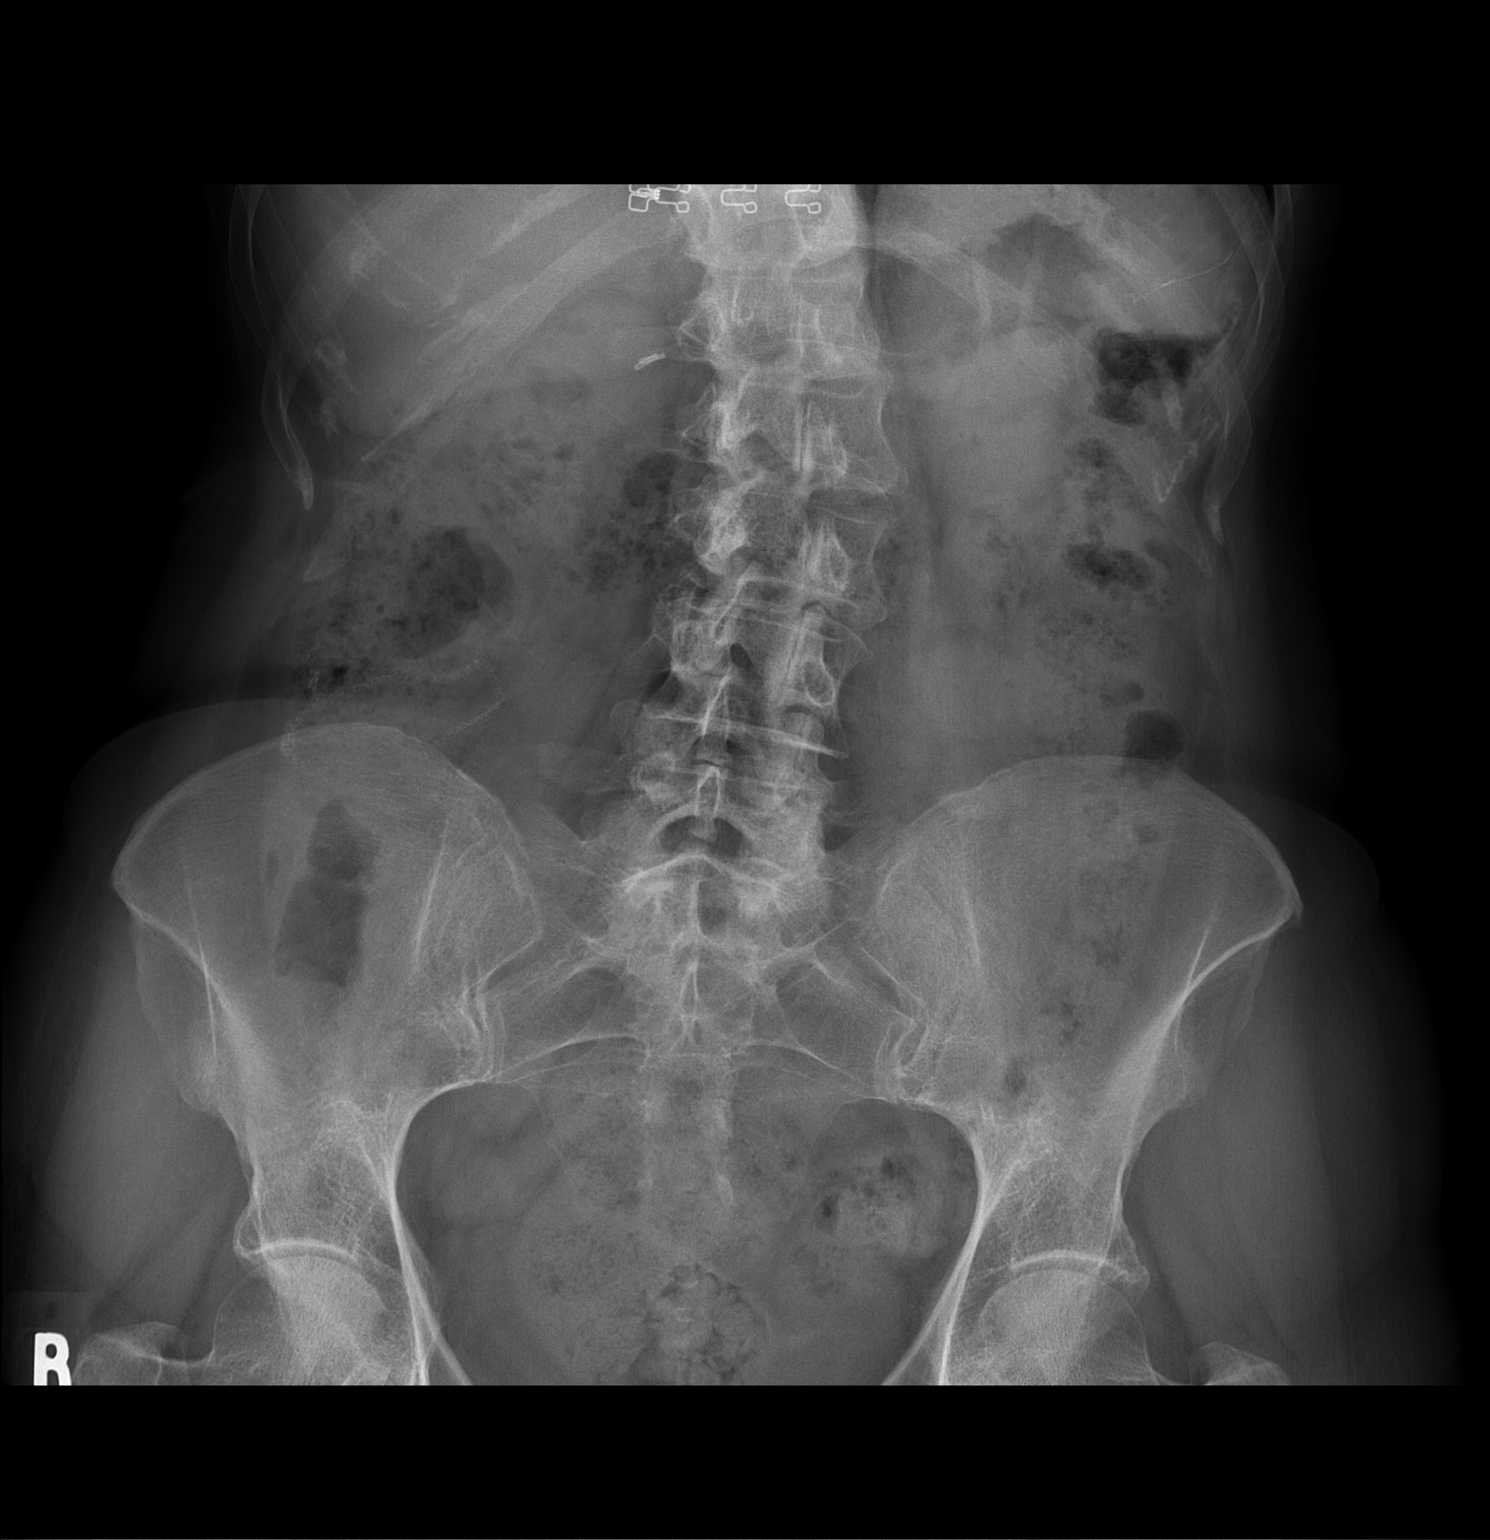

[1 of 1 positions shown; findings below may reference images not displayed]

FINDINGS: Moderate stool is present throughout the colon. Bowel gas pattern is
unremarkable. Cholecystectomy clips. Suture material overlies the
right abdomen.
IMPRESSION: Moderate stool burden.

## 2023-03-28 ENCOUNTER — Other Ambulatory Visit: Payer: Self-pay

## 2023-03-28 ENCOUNTER — Emergency Department (HOSPITAL_COMMUNITY)
Admission: EM | Admit: 2023-03-28 | Discharge: 2023-03-28 | Disposition: A | Discharge status: Home / Self Care | Payer: Medicare Other | Attending: Emergency Medicine | Admitting: Emergency Medicine

## 2023-03-28 ENCOUNTER — Encounter (HOSPITAL_COMMUNITY): Payer: Self-pay

## 2023-03-28 ENCOUNTER — Emergency Department (HOSPITAL_COMMUNITY): Payer: Medicare Other

## 2023-03-28 DIAGNOSIS — R09A2 Foreign body sensation, throat: Secondary | ICD-10-CM | POA: Diagnosis not present

## 2023-03-28 DIAGNOSIS — R1310 Dysphagia, unspecified: Secondary | ICD-10-CM | POA: Insufficient documentation

## 2023-03-28 HISTORY — DX: Gastro-esophageal reflux disease without esophagitis: K21.9

## 2023-03-28 HISTORY — DX: Peptic ulcer, site unspecified, unspecified as acute or chronic, without hemorrhage or perforation: K27.9

## 2023-03-28 LAB — CBC WITH DIFFERENTIAL/PLATELET
Abs Immature Granulocytes: 0.04 10*3/uL (ref 0.00–0.07)
Basophils Absolute: 0.2 10*3/uL — ABNORMAL HIGH (ref 0.0–0.1)
Basophils Relative: 1 %
Eosinophils Absolute: 0 10*3/uL (ref 0.0–0.5)
Eosinophils Relative: 0 %
HCT: 43.2 % (ref 36.0–46.0)
Hemoglobin: 13.9 g/dL (ref 12.0–15.0)
Immature Granulocytes: 0 %
Lymphocytes Relative: 33 %
Lymphs Abs: 4.2 10*3/uL — ABNORMAL HIGH (ref 0.7–4.0)
MCH: 30.2 pg (ref 26.0–34.0)
MCHC: 32.2 g/dL (ref 30.0–36.0)
MCV: 93.7 fL (ref 80.0–100.0)
Monocytes Absolute: 0.7 10*3/uL (ref 0.1–1.0)
Monocytes Relative: 6 %
Neutro Abs: 7.7 10*3/uL (ref 1.7–7.7)
Neutrophils Relative %: 60 %
Platelets: 349 10*3/uL (ref 150–400)
RBC: 4.61 MIL/uL (ref 3.87–5.11)
RDW: 14 % (ref 11.5–15.5)
WBC: 12.8 10*3/uL — ABNORMAL HIGH (ref 4.0–10.5)
nRBC: 0 % (ref 0.0–0.2)

## 2023-03-28 LAB — BASIC METABOLIC PANEL
Anion gap: 11 (ref 5–15)
BUN: 9 mg/dL (ref 8–23)
CO2: 24 mmol/L (ref 22–32)
Calcium: 9.7 mg/dL (ref 8.9–10.3)
Chloride: 105 mmol/L (ref 98–111)
Creatinine, Ser: 0.74 mg/dL (ref 0.44–1.00)
GFR, Estimated: >60 mL/min (ref >60)
Glucose, Bld: 103 mg/dL — ABNORMAL HIGH (ref 70–99)
Potassium: 4 mmol/L (ref 3.5–5.1)
Sodium: 140 mmol/L (ref 135–145)

## 2023-03-28 MED ORDER — SUCRALFATE 1 GM/10ML PO SUSP
1.0000 g | Freq: Three times a day (TID) | ORAL | 0 refills | Status: AC
Start: 2023-03-28 — End: ?

## 2023-03-28 MED ORDER — OMEPRAZOLE 2 MG/ML ORAL SUSPENSION: 20.0000 mg | Freq: Two times a day (BID) | ORAL | 0 refills | Status: AC

## 2023-03-28 MED ORDER — LIDOCAINE VISCOUS HCL 2 % MT SOLN
15.0000 mL | Freq: Once | OROMUCOSAL | Status: DC
  Filled 2023-03-28: qty 15

## 2023-03-28 MED ORDER — ALUM & MAG HYDROXIDE-SIMETH 200-200-20 MG/5ML PO SUSP
30.0000 mL | Freq: Once | ORAL | Status: DC
  Filled 2023-03-28: qty 30

## 2023-03-28 NOTE — ED Provider Notes (Signed)
[place] EMERGENCY DEPARTMENT AT [facility] Provider Note   CSN: [id number] Arrival date & time: 03/28/23  1234     History  Chief Complaint  Patient presents with   Sore Throat    Food Bolus    Michaela Berry is a 73 y.o. female.  HPI Patient reports she has been having difficulty swallowing pills for about 3 weeks.  She has had a sensation of things getting stuck or lodged in her throat with swallowing.  However, up until yesterday she has felt that things ultimately passed or dissolved on their own.  Yesterday evening she took medications, a nutritional supplement and it feels like it got lodged in her throat and has not gone down.  Since that time she has tried multiple attempts at swallowing liquids and trying to get it passed.  She reports that the liquids do ultimately go down but she cannot eat anything.  She reports with deep breath it feels like it is little more uncomfortable and it makes her cough.  Patient reports that she had COVID in November and got a lot of lymphadenopathy at that time.  That has since resolved.  She has not had any recent fevers chills sore throat, nasal congestion or chest pain leading up to this.  Patient reports in 2020 she had some stomach ulcers that healed.  She is consistently taking pantoprazole for GERD.    Home Medications Prior to Admission medications   Medication Sig Start Date End Date Taking? Authorizing Provider  omeprazole (KONVOMEP) 2 mg/mL SUSP oral suspension Take 10 mLs (20 mg total) by mouth 2 (two) times daily before a meal. 03/28/23  Yes Kimball Appleby, Lebron Conners, MD  sucralfate (CARAFATE) 1 GM/10ML suspension Take 10 mLs (1 g total) by mouth 4 (four) times daily -  with meals and at bedtime. 03/28/23  Yes Arby Barrette, MD  acetaminophen (TYLENOL) 500 MG tablet Take by mouth every 4 (four) hours as needed. 05/26/04   [provider]  Alirocumab (PRALUENT) 150 MG/ML SOAJ Inject 1 Dose into the skin every 14 (fourteen)  days. 09/15/20   Hilty, Lisette Abu, MD  cephALEXin (KEFLEX) 250 MG capsule Take 250 mg by mouth as needed. 12/30/18   [provider]  Cholecalciferol 50 MCG (2000 UT) TABS Take by mouth daily.    [provider]  D-Mannose 500 MG CAPS Take 500 mg by mouth every evening.    [provider]  Magnesium Oxide 250 MG TABS Take by mouth 2 (two) times daily.    [provider]  Omega-3 Fatty Acids (ODORLESS COATED FISH OIL) 1000 MG CPDR Take by mouth once a week.    [provider]  pantoprazole (PROTONIX) 40 MG tablet Take 40 mg by mouth 2 (two) times daily. 05/02/19   [provider]  Probiotic Product (ALIGN) CHEW Chew by mouth daily.    [provider]  Turmeric 400 MG CAPS Take 400 mg by mouth 2 (two) times daily.    [provider]  vitamin E 180 MG (400 UNITS) capsule Take by mouth daily.    [provider]  Zinc 30 MG TABS Take 30 mg by mouth daily.    [provider]      Allergies    Nitrous oxide, Erythromycin, Heparin, Iodinated contrast media, Metoclopramide, Nitrofurantoin, Paroxetine hcl, Risedronate, Sulfamethoxazole-trimethoprim, Cefuroxime, Other, and Quinolones    Review of Systems   Review of Systems  Physical Exam Updated Vital Signs BP 131/74  Pulse 76   Temp 98.1 F (36.7 C)   Resp (!) 23   Ht 5' 3.5" (1.613 m)   Wt 55.3 kg   SpO2 99%   BMI 21.27 kg/m  Physical Exam Constitutional:      Comments: Alert nontoxic.  Clear mental status.  No respiratory distress.  HENT:     Head: Normocephalic and atraumatic.     Nose: Nose normal.     Mouth/Throat:     Mouth: Mucous membranes are moist.     Pharynx: Oropharynx is clear.     Comments: Posterior oropharynx is widely patent.  No tonsillar enlargement or exudate.  Dentition in good condition. Eyes:     Extraocular Movements: Extraocular movements intact.     Pupils: Pupils are equal, round, and reactive to light.  Neck:      Comments: Neck is supple.  No palpable mass.  No stridor.  No supraclavicular lymphadenopathy. Cardiovascular:     Rate and Rhythm: Normal rate and regular rhythm.  Pulmonary:     Effort: Pulmonary effort is normal.     Breath sounds: Normal breath sounds.  Abdominal:     General: There is no distension.     Palpations: Abdomen is soft.  Musculoskeletal:        General: Normal range of motion.     Cervical back: Neck supple.  Skin:    General: Skin is warm and dry.  Neurological:     General: No focal deficit present.     Mental Status: She is oriented to person, place, and time.     Motor: No weakness.     Coordination: Coordination normal.     ED Results / Procedures / Treatments   Labs (all labs ordered are listed, but only abnormal results are displayed) Labs Reviewed  BASIC METABOLIC PANEL - Abnormal; Notable for the following components:      Result Value   Glucose, Bld 103 (*)    All other components within normal limits  CBC WITH DIFFERENTIAL/PLATELET - Abnormal; Notable for the following components:   WBC 12.8 (*)    Lymphs Abs 4.2 (*)    Basophils Absolute 0.2 (*)    All other components within normal limits    EKG None  Radiology No results found.   Procedures Procedures    Medications Ordered in ED Medications - No data to display   ED Course/ Medical Decision Making/ A&P                                 Medical Decision Making Amount and/or Complexity of Data Reviewed Labs: ordered. Radiology: ordered.  Risk OTC drugs. Prescription drug management.   Presents as outlined.  She has been having some incremental dysphagia and perception of foods and medications sticking with swallowing.  As of yesterday evening she has had persistent foreign body sensation after taking a pill.  She is handling secretions appropriately and exhibits no respiratory distress.  Consult: 16: 40 to review to Dr. Ewing Schlein.  Advises if patient is swallowing secretions  and liquids safe to try some symptomatic trial with viscous lidocaine and continue liquids with follow-up in the office.  He advises they will work her into the schedule this week.  Patient declined viscous lidocaine having had an adverse reaction previously.  Extensively discussed the possibility of proceeding with CT scan.  Patient has intolerance of IV contrast.  We discussed the merits of  a noncontrast CT for potentially identifying any compressive type lesion or significant inflammatory change around the esophagus.  After extensive review, patient declines to proceed with CT scan and will try symptomatic treatment at home and expeditious follow-up with GI.        Final Clinical Impression(s) / ED Diagnoses Final diagnoses:  Dysphagia, unspecified type  Foreign body sensation in throat    Rx / DC Orders ED Discharge Orders          Ordered    omeprazole (KONVOMEP) 2 mg/mL SUSP oral suspension  2 times daily before meals        03/28/23 1850    sucralfate (CARAFATE) 1 GM/10ML suspension  3 times daily with meals & bedtime        03/28/23 1850              Arby Barrette, MD 04/04/23 1103

## 2023-03-28 NOTE — Discharge Instructions (Signed)
1.  Only eat very thin foods like soups and liquids.  You could also eat popsicles and other cold things to help with discomfort. 2.  I have prescribed a liquid form of omeprazole.  Try to get this from your pharmacy and start as soon as possible.  You are going to try to avoid all pills at this time.  If you cannot fill this medication, get some liquid antacids over-the-counter and take those as directed.  Also, you have been prescribed Carafate in a liquid form to take to help coat and soothe her esophagus and stomach. 3.  Follow-up with your gastroenterologist this week.  [facility] was made aware of your visit to the emergency department and are dissipating contacting you at the beginning of the week for your follow-up. 4.  At this time there is no signs of any blockage to your airway.  However if you feel that this is worsening or changing, return immediately for recheck.

## 2023-03-28 NOTE — ED Triage Notes (Addendum)
Pt presents with the sensation of pills stuck in her throat since last PM. Pt states she is able to pass food and water without vomiting. Pt reports problems with choking and swallowing x 3 weeks. Pt has hx of GERD and stomach ulcers. Last endoscopy was in 2020 that showed peptic ulcers and no structural changes to the esophagus.

## 2023-03-28 NOTE — ED Notes (Signed)
The pt and husband left without vitals they reported that they had waited too long

## 2023-04-08 ENCOUNTER — Telehealth (HOSPITAL_COMMUNITY): Payer: Self-pay | Admitting: *Deleted

## 2023-04-08 NOTE — Telephone Encounter (Signed)
Attempted to contact patient to schedule OP MBS. Left VM. RKEEL

## 2023-04-23 ENCOUNTER — Telehealth (HOSPITAL_COMMUNITY): Payer: Self-pay | Admitting: *Deleted

## 2023-04-23 NOTE — Telephone Encounter (Signed)
Attempted to contact patient to schedule OP MBS. Left VM @ [phone]. RKEEL

## 2023-04-30 ENCOUNTER — Telehealth (HOSPITAL_COMMUNITY): Payer: Self-pay | Admitting: *Deleted

## 2023-04-30 NOTE — Telephone Encounter (Signed)
Contacted patient to schedule OP MBS. She refused at this time and requested I cancel the order. She will need a new order in the future to schedule this test. RKEEL

## 2023-06-29 ENCOUNTER — Ambulatory Visit: Attending: Family Medicine | Admitting: Speech Pathology

## 2023-06-29 ENCOUNTER — Encounter: Payer: Self-pay | Admitting: Speech Pathology

## 2023-06-29 DIAGNOSIS — R1310 Dysphagia, unspecified: Secondary | ICD-10-CM | POA: Diagnosis present

## 2023-06-29 NOTE — Therapy (Signed)
 OUTPATIENT SPEECH LANGUAGE PATHOLOGY SWALLOW EVALUATION   Patient Name: Michaela Berry MRN: [id number] DOB:15-Jul-1950, 73 y.o., female Today's Date: 06/29/2023  PCP: Ransom Byers., MD REFERRING PROVIDER: Ransom Byers., MD  END OF SESSION:  End of Session - 06/29/23 1222     Visit Number 1    Number of Visits 5    Date for SLP Re-Evaluation 08/10/23    SLP Start Time 0800    SLP Stop Time  0845    SLP Time Calculation (min) 45 min    Activity Tolerance Patient tolerated treatment well             Past Medical History:  Diagnosis Date   Cancer (HCC)    GERD (gastroesophageal reflux disease)    PUD (peptic ulcer disease)    Past Surgical History:  Procedure Laterality Date   ABDOMINAL HYSTERECTOMY     APPENDECTOMY     CHOLECYSTECTOMY     COLON SURGERY     DIAPHRAGM SURGERY     HIPEC     SPLENECTOMY     There are no active problems to display for this patient.   ONSET DATE: January 2025   REFERRING DIAG: Dysphagia  THERAPY DIAG:  Dysphagia, unspecified type  Rationale for Evaluation and Treatment: Rehabilitation  SUBJECTIVE:   SUBJECTIVE STATEMENT: Pt reports challenges swallowing pills, c/b sensation of "sticking" in throat lasting hours.  Pt accompanied by: self  PERTINENT HISTORY: 3 months of pill dysphagia s/p COVID infection. EGD with balloon dilation performed with no perceived benefit.   PAIN:  Are you having pain? No  FALLS: Has patient fallen in last 6 months?  No  LIVING ENVIRONMENT: Lives with: lives with their family and lives with their spouse Lives in: House/apartment  PLOF:  Level of assistance: Independent with ADLs, Independent with IADLs Employment: Retired  PATIENT GOALS: "I want to be able to take that pill"  OBJECTIVE:  Note: Objective measures were completed at Evaluation unless otherwise noted. OBJECTIVE:   INSTRUMENTAL SWALLOW STUDY FINDINGS: MBSS is recommended by SLP, pt hesitant d/t prior  radiation exposure. SLP suggests FEES as an alternative with pt expressing hesitancy related to reported diagnostic fatigue and view of limited benefit.   COGNITION: Overall cognitive status: Within functional limits for tasks assessed  SUBJECTIVE DYSPHAGIA REPORTS:  Date of onset: January 2025 Reported symptoms: globus sensation with pills  Current diet: regular  Co-morbid voice changes: No  FACTORS WHICH MAY INCREASE RISK OF ADVERSE EVENT IN PRESENCE OF ASPIRATION:  General health: well appearing  Risk factors: GERD or other GI disease    ORAL MOTOR EXAMINATION: Overall status: WFL   CLINICAL SWALLOW ASSESSMENT:   Dentition: adequate natural dentition Vocal quality at baseline: normal Patient directly observed with POs: Yes: thin liquids  Feeding: able to feed self Liquids provided by: cup [facility] Swallow Protocol: Pass  PATIENT REPORTED OUTCOME MEASURES (PROM): EAT-10: 11   PILL-5: 13 Question Patient's Response  My swallowing problem has caused me to lose weight 1  2.  My swallowing problem interferes with my ability to go out to meals 1  3.  Swallowing liquids takes extra effort 0  4.  Swallowing solids takes extra effort 0  5.  Swallowing pills takes extra effort 4  6.  Swallowing is painful 1  7.  The pleasure of eating is affected by my swallowing 2  8.  When I swallow food sticks in my throat 0  9.  I cough when I  eat 1  10.  Swallowing is stressful  1  0= No problem 4= Severe problem                                                                                                                            TREATMENT DATE:  06/29/23: Education provided regarding techniques and strategies to aid in pill consumption. Pt may find benefit from using thicker texture, following with liquids or puree, head turn right. Advised that it is unlikely pt has pharyngeal weakness as no s/sx aspiration or dysphagia present with solids or liquids. Education provided regarding potential  referred sensation in setting of known esophageal issues.   PATIENT EDUCATION: Education details: See above  Person educated: Patient Education method: Explanation Education comprehension: verbalized understanding and needs further education   ASSESSMENT:  CLINICAL IMPRESSION: Patient is a 73 y.o. F who was seen today for dysphagia when swallowing pills. Oral mechanism exam complete, unremarkable. Pt with intact dentition, apparently good oral care. Denies challenges with foods or liquids - no sticking, no coughing, no regurgitation, no vocal changes. Is crushing pills or taking liquid alternatives. [facility] Swallow Protocol completed, with pass, indicating low likelihood of aspiration. Since pt only having trouble with pills, it is unlikely pt has pharyngeal weakness. Would need instrumental swallow study to be sure but pt is hesitant. Advised of risks and benefits. Pt to consider and contact clinic if she decides to move forward. SLP unable to provide treatment based on sx without study. Will defer scheduling pending decision re: swallow study.   OBJECTIVE IMPAIRMENTS: include dysphagia. These impairments are limiting patient from  effective and comfortable swallowing . Factors affecting potential to achieve goals and functional outcome are cooperation/participation level. Patient will benefit from skilled SLP services to address above impairments and improve overall function.  REHAB POTENTIAL: Good   GOALS: Goals reviewed with patient? Yes  LONG TERM GOALS: Target date: 08/10/2023  Pt will perform instrumental swallow study Baseline:  Goal status: INITIAL  2.  GOALS TO BE ADDED BASED ON INSTRUMENTAL FINDINGS Baseline:  Goal status: INITIAL  PLAN:  SLP FREQUENCY: 1-2x/week  SLP DURATION: 6 weeks  PLANNED INTERVENTIONS: Aspiration precaution training, Pharyngeal strengthening exercises, Diet toleration management , Trials of upgraded texture/liquids, SLP instruction and feedback,  Compensatory strategies, and Patient/family education  Alston Jerry, CCC-SLP 06/29/2023, 12:23 PM
